# Patient Record
Sex: Male | Born: 1985 | Race: White | Hispanic: No | Marital: Single | Smoking: Current every day smoker
Health system: Southern US, Community
[De-identification: ages and names within clinical notes are randomized; demographics above are authoritative.]

---

## 2017-12-22 ENCOUNTER — Other Ambulatory Visit: Payer: Self-pay

## 2017-12-22 ENCOUNTER — Emergency Department (HOSPITAL_COMMUNITY)
Admission: EM | Admit: 2017-12-22 | Discharge: 2017-12-23 | Disposition: A | Discharge status: Home / Self Care | Payer: Self-pay | Attending: Emergency Medicine | Admitting: Emergency Medicine

## 2017-12-22 ENCOUNTER — Emergency Department (HOSPITAL_COMMUNITY): Payer: Self-pay

## 2017-12-22 ENCOUNTER — Encounter (HOSPITAL_COMMUNITY): Payer: Self-pay | Admitting: Emergency Medicine

## 2017-12-22 DIAGNOSIS — F17210 Nicotine dependence, cigarettes, uncomplicated: Secondary | ICD-10-CM | POA: Insufficient documentation

## 2017-12-22 DIAGNOSIS — K8010 Calculus of gallbladder with chronic cholecystitis without obstruction: Secondary | ICD-10-CM | POA: Diagnosis present

## 2017-12-22 DIAGNOSIS — R1011 Right upper quadrant pain: Secondary | ICD-10-CM | POA: Insufficient documentation

## 2017-12-22 DIAGNOSIS — Z79899 Other long term (current) drug therapy: Secondary | ICD-10-CM | POA: Insufficient documentation

## 2017-12-22 LAB — URINALYSIS, ROUTINE W REFLEX MICROSCOPIC
Bacteria, UA: NONE SEEN
Bilirubin Urine: NEGATIVE
GLUCOSE, UA: NEGATIVE mg/dL
Hgb urine dipstick: NEGATIVE
KETONES UR: 20 mg/dL — AB
LEUKOCYTES UA: NEGATIVE
Nitrite: NEGATIVE
Protein, ur: 30 mg/dL — AB
Specific Gravity, Urine: 1.027 (ref 1.005–1.030)
pH: 5 (ref 5.0–8.0)

## 2017-12-22 MED ORDER — ONDANSETRON HCL 4 MG/2ML IJ SOLN
4.0000 mg | Freq: Once | INTRAMUSCULAR | Status: AC
  Administered 2017-12-23: 4 mg via INTRAVENOUS
  Filled 2017-12-22: qty 2

## 2017-12-22 MED ORDER — SODIUM CHLORIDE 0.9 % IV BOLUS
1000.0000 mL | Freq: Once | INTRAVENOUS | Status: AC
  Administered 2017-12-23: 1000 mL via INTRAVENOUS

## 2017-12-22 MED ORDER — MORPHINE SULFATE (PF) 4 MG/ML IV SOLN
4.0000 mg | Freq: Once | INTRAVENOUS | Status: AC
  Administered 2017-12-23: 4 mg via INTRAVENOUS
  Filled 2017-12-22: qty 1

## 2017-12-22 NOTE — ED Provider Notes (Signed)
[facility]-EMERGENCY DEPT Provider Note   CSN: [id number] Arrival date & time: 12/22/17  1859     History   Chief Complaint Chief Complaint  Patient presents with  . Abdominal Pain    HPI Derrick Parks is a 32 y.o. male.  HPI 32 year old Caucasian male with no pertinent past medical history presents to the ED for evaluation of right upper quadrant abdominal pain with associated abdominal distention, nausea, decreased appetite and constipation.  Patient states that his abdominal pain started 3 days ago.  Has been constant.  Describes it as cramping in nature.  Patient also reports last bowel movement was yesterday.  Patient thought that his pain was due to gas and took a Gas-X and Mylanta at home with little relief.  Patient endorses nausea and vomiting x2.  Patient denies any urinary symptoms.  Patient seen in urgent care today and sent to the ED for possible cholecystitis.  Denies abdominal surgeries.  Does report passing gas.  Denies any melena or hematochezia.  Pt denies any fever, chill, ha, vision changes, lightheadedness, dizziness, congestion, neck pain, cp, sob, cough,urinary symptoms,  melena, hematochezia, lower extremity paresthesias.  History reviewed. No pertinent past medical history.  There are no active problems to display for this patient.         Home Medications    Prior to Admission medications   Medication Sig Start Date End Date Taking? Authorizing Provider  calcium carbonate (TUMS - DOSED IN MG ELEMENTAL CALCIUM) 500 MG chewable tablet Chew 2 tablets by mouth 3 (three) times daily as needed for indigestion or heartburn.   Yes [provider]  ibuprofen (ADVIL,MOTRIN) 200 MG tablet Take 400 mg by mouth every 6 (six) hours as needed for moderate pain.   Yes [provider]  omeprazole (PRILOSEC) 20 MG capsule Take 20 mg by mouth daily as needed (Heart burn).   Yes [provider]  Simethicone (MYLANTA GAS  MINIS) 41.667 MG CHEW Chew 3 tablets by mouth 3 (three) times daily as needed (heart burn).   Yes [provider]    Family History No family history on file.  Social History Social History   Tobacco Use  . Smoking status: Current Every Day Smoker    Types: E-cigarettes  . Smokeless tobacco: Never Used  Substance Use Topics  . Alcohol use: Not on file  . Drug use: Not on file     Allergies   Patient has no known allergies.   Review of Systems Review of Systems  All other systems reviewed and are negative.    Physical Exam Updated Vital Signs BP (!) 147/98 (BP Location: Right Arm)   Pulse (!) 109   Temp 99 F (37.2 C) (Oral)   Resp 20   SpO2 97%   Physical Exam  Constitutional: He is oriented to person, place, and time. He appears well-developed and well-nourished.  Non-toxic appearance. No distress.  HENT:  Head: Normocephalic and atraumatic.  Mouth/Throat: Oropharynx is clear and moist.  Eyes: Pupils are equal, round, and reactive to light. Conjunctivae are normal. Right eye exhibits no discharge. Left eye exhibits no discharge.  Neck: Normal range of motion. Neck supple.  Cardiovascular: Normal rate, regular rhythm, normal heart sounds and intact distal pulses. Exam reveals no gallop and no friction rub.  No murmur heard. Pulmonary/Chest: Effort normal and breath sounds normal. No respiratory distress. He exhibits no tenderness.  Abdominal: Soft. Normal appearance and bowel sounds are normal. He exhibits no distension. There  is tenderness in the right upper quadrant and epigastric area. There is no rigidity, no rebound, no guarding, no CVA tenderness, no tenderness at McBurney's point and negative Murphy's sign.  Obese abd  Musculoskeletal: Normal range of motion. He exhibits no tenderness.  Lymphadenopathy:    He has no cervical adenopathy.  Neurological: He is alert and oriented to person, place, and time.  Skin: Skin is warm and dry. Capillary  refill takes less than 2 seconds. No rash noted.  Psychiatric: His behavior is normal. Judgment and thought content normal.  Nursing note and vitals reviewed.    ED Treatments / Results  Labs (all labs ordered are listed, but only abnormal results are displayed) Labs Reviewed  URINALYSIS, ROUTINE W REFLEX MICROSCOPIC - Abnormal; Notable for the following components:      Result Value   Color, Urine AMBER (*)    Ketones, ur 20 (*)    Protein, ur 30 (*)    All other components within normal limits  CBC WITH DIFFERENTIAL/PLATELET - Abnormal; Notable for the following components:   WBC 11.0 (*)    Neutro Abs 8.7 (*)    All other components within normal limits  COMPREHENSIVE METABOLIC PANEL - Abnormal; Notable for the following components:   Glucose, Bld 107 (*)    Calcium 8.7 (*)    Total Bilirubin 1.3 (*)    All other components within normal limits  LIPASE, BLOOD    EKG None  Radiology Dg Abdomen 1 View  Result Date: 12/23/2017 CLINICAL DATA:  Constipation EXAM: ABDOMEN - 1 VIEW COMPARISON:  None. FINDINGS: Increased stool retention is identified along the ascending colon. No bowel obstruction is identified. Phleboliths are present within the pelvis. Genitourinary calculus is apparent. No acute osseous abnormality is seen. Mild facet sclerosis at L5-S1 bilaterally. IMPRESSION: Increased colonic stool retention along the ascending colon. Otherwise negative. Electronically Signed   By: Tollie Eth M.D.   On: 12/23/2017 00:01   US Abdomen Limited  Result Date: 12/23/2017 CLINICAL DATA:  Right upper quadrant pain for 3 days. EXAM: ULTRASOUND ABDOMEN LIMITED RIGHT UPPER QUADRANT COMPARISON:  None. FINDINGS: Gallbladder: Physiologically distended containing multiple gallstones. There is gallbladder wall thickening up to 12 mm. No pericholecystic fluid. No sonographic Murphy sign noted by sonographer. Common bile duct: Diameter: 5 mm. Liver: Diffusely increased parenchymal echogenicity,  liver parenchyma is difficult to penetrate. No evidence of focal lesion. Portal vein is patent on color Doppler imaging with normal direction of blood flow towards the liver. IMPRESSION: 1. Multiple gallstones with gallbladder wall thickening but no pericholecystic fluid or sonographic Murphy sign. Lack of sonographic Murphy sign and degree of wall thickening favors chronic gallbladder disease over acute inflammation. Consider nuclear medicine HIDA scan for further evaluation. 2. Hepatic steatosis. Electronically Signed   By: Rubye Oaks M.D.   On: 12/23/2017 00:22    Procedures Procedures (including critical care time)  Medications Ordered in ED Medications  ondansetron (ZOFRAN) injection 4 mg (4 mg Intravenous Given 12/23/17 0015)  morphine 4 MG/ML injection 4 mg (4 mg Intravenous Given 12/23/17 0015)  sodium chloride 0.9 % bolus 1,000 mL (0 mLs Intravenous Stopped 12/23/17 0116)  ketorolac (TORADOL) 30 MG/ML injection 15 mg (15 mg Intravenous Given 12/23/17 0223)     Initial Impression / Assessment and Plan / ED Course  I have reviewed the triage vital signs and the nursing notes.  Pertinent labs & imaging results that were available during my care of the patient were reviewed by me and  considered in my medical decision making (see chart for details).     Patient presents to the ED with right upper quadrant abdominal pain with associated nausea and constipation.  Patient afebrile in the ED.  Otherwise vital signs reassuring.  On exam patient has a pain to palpation in the right upper quadrant with a positive Murphy sign.  No signs of surgical abdomen.  Bowel sounds present in all 4 quadrants.  Patient has a mild leukocytosis of 11,000.  Otherwise lab work reassuring.  Normal liver enzymes and lipase.  UA shows no signs of infection.  Ultrasound was obtained that shows thickened gallbladder wall with multiple gallstones.  KUB shows moderate colonic stool burden but no signs of obstruction.   Patient's pain improved with Toradol and morphine in the ED.  Tolerating p.o. fluids.  Did discuss patient with Dr. Gerrit Friends with general surgery who evaluated patient.  Refer to his consult note.  He offered patient admission and cholecystectomy this week and however patient refused and [facility]  The patient is hemodynamic stable.  Does not meet Sirs or sepsis criteria.  Repeat examination of abdomen shows a benign abdomen.  Tolerating p.o. fluids.  Patient has no pericholecystic fluid.  Dr. Gerrit Friends and myself feel comfortable patient discharged with follow-up next week at the [place].  Will give course of pain medication nausea medication for home use.  Discussed reasons to return to ED immediately.  Pt is hemodynamically stable, in NAD, & able to ambulate in the ED. Evaluation does not show pathology that would require ongoing emergent intervention or inpatient treatment. I explained the diagnosis to the patient. Pain has been managed & has no complaints prior to dc. Pt is comfortable with above plan and is stable for discharge at this time. All questions were answered prior to disposition. Strict return precautions for f/u to the ED were discussed. Encouraged follow up with PCP.   Final Clinical Impressions(s) / ED Diagnoses   Final diagnoses:  RUQ pain    ED Discharge Orders         Ordered    HYDROcodone-acetaminophen (NORCO/VICODIN) 5-325 MG tablet  Every 4 hours PRN     12/23/17 0311    ondansetron (ZOFRAN ODT) 4 MG disintegrating tablet  Every 8 hours PRN     12/23/17 0311           Rise Mu, PA-C 12/23/17 1610    Derwood Kaplan, MD 12/23/17 2318

## 2017-12-22 NOTE — ED Notes (Signed)
Urine and culture sent down to lab 

## 2017-12-22 NOTE — ED Triage Notes (Signed)
Pt arriving with abdominal pain x3 days. Pt reports taking gas-x and mylanta at home with little relief. Last BM yesterday. Endorses nausea and vomiting x2.

## 2017-12-23 ENCOUNTER — Encounter (HOSPITAL_COMMUNITY): Payer: Self-pay | Admitting: Surgery

## 2017-12-23 DIAGNOSIS — K8010 Calculus of gallbladder with chronic cholecystitis without obstruction: Secondary | ICD-10-CM | POA: Diagnosis present

## 2017-12-23 LAB — COMPREHENSIVE METABOLIC PANEL
ALBUMIN: 3.6 g/dL (ref 3.5–5.0)
ALK PHOS: 41 U/L (ref 38–126)
ALT: 34 U/L (ref 0–44)
ANION GAP: 10 (ref 5–15)
AST: 23 U/L (ref 15–41)
BILIRUBIN TOTAL: 1.3 mg/dL — AB (ref 0.3–1.2)
BUN: 15 mg/dL (ref 6–20)
CO2: 25 mmol/L (ref 22–32)
Calcium: 8.7 mg/dL — ABNORMAL LOW (ref 8.9–10.3)
Chloride: 100 mmol/L (ref 98–111)
Creatinine, Ser: 1.14 mg/dL (ref 0.61–1.24)
GFR calc Af Amer: >60 mL/min (ref >60)
GFR calc non Af Amer: >60 mL/min (ref >60)
GLUCOSE: 107 mg/dL — AB (ref 70–99)
Potassium: 4.1 mmol/L (ref 3.5–5.1)
Sodium: 135 mmol/L (ref 135–145)
TOTAL PROTEIN: 7.2 g/dL (ref 6.5–8.1)

## 2017-12-23 LAB — CBC WITH DIFFERENTIAL/PLATELET
BASOS ABS: 0 10*3/uL (ref 0.0–0.1)
Basophils Relative: 0 %
Eosinophils Absolute: 0.1 10*3/uL (ref 0.0–0.7)
Eosinophils Relative: 1 %
HEMATOCRIT: 43.3 % (ref 39.0–52.0)
Hemoglobin: 15 g/dL (ref 13.0–17.0)
Lymphocytes Relative: 11 %
Lymphs Abs: 1.2 10*3/uL (ref 0.7–4.0)
MCH: 28.5 pg (ref 26.0–34.0)
MCHC: 34.6 g/dL (ref 30.0–36.0)
MCV: 82.3 fL (ref 78.0–100.0)
MONO ABS: 1 10*3/uL (ref 0.1–1.0)
MONOS PCT: 9 %
NEUTROS ABS: 8.7 10*3/uL — AB (ref 1.7–7.7)
Neutrophils Relative %: 79 %
Platelets: 205 10*3/uL (ref 150–400)
RBC: 5.26 MIL/uL (ref 4.22–5.81)
RDW: 13.8 % (ref 11.5–15.5)
WBC: 11 10*3/uL — ABNORMAL HIGH (ref 4.0–10.5)

## 2017-12-23 LAB — LIPASE, BLOOD: Lipase: 27 U/L (ref 11–51)

## 2017-12-23 MED ORDER — KETOROLAC TROMETHAMINE 30 MG/ML IJ SOLN
15.0000 mg | Freq: Once | INTRAMUSCULAR | Status: AC
  Administered 2017-12-23: 15 mg via INTRAVENOUS
  Filled 2017-12-23: qty 1

## 2017-12-23 MED ORDER — HYDROCODONE-ACETAMINOPHEN 5-325 MG PO TABS: 2.0000 | ORAL_TABLET | ORAL | 0 refills | Status: AC | PRN

## 2017-12-23 MED ORDER — ONDANSETRON 4 MG PO TBDP: 4.0000 mg | ORAL_TABLET | Freq: Three times a day (TID) | ORAL | 0 refills | Status: AC | PRN

## 2017-12-23 NOTE — Discharge Instructions (Addendum)
You do have concern for a gallbladder infection.  This will need to be seen by a general surgeon.  Have given you pain medication to take in some Zofran for nausea.  If you develop any fevers, worsening pain or worsening vomiting return to the ED immediately.  [facility]

## 2017-12-23 NOTE — ED Notes (Signed)
US tech at bedside

## 2017-12-23 NOTE — Consult Note (Signed)
General Surgery [facility]  Reason for Consult: abdominal pain, chronic cholecystitis, cholelithiasis  Referring Physician: Dr. Kathrynn Humble, [facility]  Derrick Parks is an 32 y.o. male.  HPI: patient is a 32 yo WM who presents with 3 day hx of abdominal pain in the upper abdomen, nausea, and emesis.  No prior episodes of pain like this.  Denies jaundice or acholic stools.  No prior abdominal surgery.  WBC 11K.  USN shows thickened gallbladder wall with multiple gallstones.  LFT's and lipase normal.  Pain has improved.  Patient is offered admission and cholecystectomy this weekend.  Patient would like to follow up at the [facility] medical center and have his surgery in the [place] system.  Asked about dietary restrictions.  Accompanied by family.  History reviewed. No pertinent past medical history.  History reviewed. No pertinent surgical history.  History reviewed. No pertinent family history.  Social History:  reports that he has been smoking e-cigarettes. He has never used smokeless tobacco. His alcohol and drug histories are not on file.  Allergies: No Known Allergies  Medications: I have reviewed the patient's current medications.  Results for orders placed or performed during the hospital encounter of 12/22/17 (from the past 48 hour(s))  Urinalysis, Routine w reflex microscopic     Status: Abnormal   Collection Time: 12/22/17  8:19 PM  Result Value Ref Range   Color, Urine AMBER (A) YELLOW    Comment: BIOCHEMICALS MAY BE AFFECTED BY COLOR   APPearance CLEAR CLEAR   Specific Gravity, Urine 1.027 1.005 - 1.030   pH 5.0 5.0 - 8.0   Glucose, UA NEGATIVE NEGATIVE mg/dL   Hgb urine dipstick NEGATIVE NEGATIVE   Bilirubin Urine NEGATIVE NEGATIVE   Ketones, ur 20 (A) NEGATIVE mg/dL   Protein, ur 30 (A) NEGATIVE mg/dL   Nitrite NEGATIVE NEGATIVE   Leukocytes, UA NEGATIVE NEGATIVE   RBC / HPF 0-5 0 - 5 RBC/hpf   WBC, UA 0-5 0 - 5 WBC/hpf   Bacteria, UA NONE SEEN NONE SEEN    Mucus PRESENT     Comment: Performed at [facility], [place] [address], [place], [place] [address]  CBC with Differential     Status: Abnormal   Collection Time: 12/22/17 11:45 PM  Result Value Ref Range   WBC 11.0 (H) 4.0 - 10.5 K/uL   RBC 5.26 4.22 - 5.81 MIL/uL   Hemoglobin 15.0 13.0 - 17.0 g/dL   HCT 43.3 39.0 - 52.0 %   MCV 82.3 78.0 - 100.0 fL   MCH 28.5 26.0 - 34.0 pg   MCHC 34.6 30.0 - 36.0 g/dL   RDW 13.8 11.5 - 15.5 %   Platelets 205 150 - 400 K/uL   Neutrophils Relative % 79 %   Neutro Abs 8.7 (H) 1.7 - 7.7 K/uL   Lymphocytes Relative 11 %   Lymphs Abs 1.2 0.7 - 4.0 K/uL   Monocytes Relative 9 %   Monocytes Absolute 1.0 0.1 - 1.0 K/uL   Eosinophils Relative 1 %   Eosinophils Absolute 0.1 0.0 - 0.7 K/uL   Basophils Relative 0 %   Basophils Absolute 0.0 0.0 - 0.1 K/uL    Comment: Performed at [facility], [place] [address] [place], [place] [address]  Lipase, blood     Status: None   Collection Time: 12/22/17 11:45 PM  Result Value Ref Range   Lipase 27 11 - 51 U/L    Comment: Performed at [facility], [place] [address] [place], [place] [address]  Comprehensive metabolic panel     Status: Abnormal   Collection Time: 12/22/17 11:45 PM  Result Value Ref Range   Sodium 135 135 - 145 mmol/L   Potassium 4.1 3.5 - 5.1 mmol/L   Chloride 100 98 - 111 mmol/L   CO2 25 22 - 32 mmol/L   Glucose, Bld 107 (H) 70 - 99 mg/dL   BUN 15 6 - 20 mg/dL   Creatinine, Ser 1.14 0.61 - 1.24 mg/dL   Calcium 8.7 (L) 8.9 - 10.3 mg/dL   Total Protein 7.2 6.5 - 8.1 g/dL   Albumin 3.6 3.5 - 5.0 g/dL   AST 23 15 - 41 U/L   ALT 34 0 - 44 U/L   Alkaline Phosphatase 41 38 - 126 U/L   Total Bilirubin 1.3 (H) 0.3 - 1.2 mg/dL   GFR calc non Af Amer >60 >60 mL/min   GFR calc Af Amer >60 >60 mL/min    Comment: (NOTE) The eGFR has been calculated using the CKD EPI equation. This calculation has not been validated in all clinical  situations. eGFR's persistently <60 mL/min signify possible Chronic Kidney Disease.    Anion gap 10 5 - 15    Comment: Performed at [facility], [place] [address], [place], [place] [address]    Dg Abdomen 1 View  Result Date: 12/23/2017 CLINICAL DATA:  Constipation EXAM: ABDOMEN - 1 VIEW COMPARISON:  None. FINDINGS: Increased stool retention is identified along the ascending colon. No bowel obstruction is identified. Phleboliths are present within the pelvis. Genitourinary calculus is apparent. No acute osseous abnormality is seen. Mild facet sclerosis at L5-S1 bilaterally. IMPRESSION: Increased colonic stool retention along the ascending colon. Otherwise negative. Electronically Signed   By: Ashley Royalty M.D.   On: 12/23/2017 00:01   US Abdomen Limited  Result Date: 12/23/2017 CLINICAL DATA:  Right upper quadrant pain for 3 days. EXAM: ULTRASOUND ABDOMEN LIMITED RIGHT UPPER QUADRANT COMPARISON:  None. FINDINGS: Gallbladder: Physiologically distended containing multiple gallstones. There is gallbladder wall thickening up to 12 mm. No pericholecystic fluid. No sonographic Murphy sign noted by sonographer. Common bile duct: Diameter: 5 mm. Liver: Diffusely increased parenchymal echogenicity, liver parenchyma is difficult to penetrate. No evidence of focal lesion. Portal vein is patent on color Doppler imaging with normal direction of blood flow towards the liver. IMPRESSION: 1. Multiple gallstones with gallbladder wall thickening but no pericholecystic fluid or sonographic Murphy sign. Lack of sonographic Murphy sign and degree of wall thickening favors chronic gallbladder disease over acute inflammation. Consider nuclear medicine HIDA scan for further evaluation. 2. Hepatic steatosis. Electronically Signed   By: Jeb Levering M.D.   On: 12/23/2017 00:22    Review of Systems  Constitutional: Negative for chills and fever.  HENT: Negative.   Eyes: Negative.   Respiratory:  Negative.   Cardiovascular: Negative.   Gastrointestinal: Positive for abdominal pain (RUQ), nausea and vomiting.  Genitourinary: Negative.   Musculoskeletal: Negative.   Skin: Negative.   Neurological: Negative.   Endo/Heme/Allergies: Negative.   Psychiatric/Behavioral: Negative.    Blood pressure 127/89, pulse 88, temperature 99 F (37.2 C), temperature source Oral, resp. rate 14, SpO2 98 %. Physical Exam  Constitutional: He is oriented to person, place, and time. He appears well-developed and well-nourished. No distress.  HENT:  Head: Normocephalic and atraumatic.  Right Ear: External ear normal.  Left Ear: External ear normal.  Mouth/Throat: No oropharyngeal exudate.  Eyes: Pupils are equal,  round, and reactive to light. Conjunctivae are normal. No scleral icterus.  Neck: Normal range of motion. Neck supple. No tracheal deviation present. No thyromegaly present.  Cardiovascular: Normal rate, regular rhythm and normal heart sounds.  No murmur heard. Respiratory: Effort normal and breath sounds normal. No respiratory distress. He has no wheezes.  GI: Soft. Bowel sounds are normal. He exhibits no distension and no mass. There is tenderness (mild RUQ tenderness). There is no rebound and no guarding.  Musculoskeletal: Normal range of motion. He exhibits no edema, tenderness or deformity.  Neurological: He is alert and oriented to person, place, and time.  Skin: Skin is warm and dry. He is not diaphoretic.  Psychiatric: He has a normal mood and affect. His behavior is normal.    Assessment/Plan: Biliary colic, chronic cholecystitis, cholelithiasis  Discussed admission and cholecystectomy - patient refused  Patient prefers follow up and surgery in the [place] medical system  Recommend pain Rx, low fat diet, follow up as soon as possible  Return to ER if persistent or worsening symptoms  Armandina Gemma, MD [facility] Surgery Office: [place] 12/23/2017, 2:21  AM

## 2018-01-19 ENCOUNTER — Other Ambulatory Visit: Payer: Self-pay | Admitting: Surgery

## 2018-01-19 DIAGNOSIS — K8080 Other cholelithiasis without obstruction: Secondary | ICD-10-CM

## 2018-01-24 ENCOUNTER — Ambulatory Visit
Admission: RE | Admit: 2018-01-24 | Discharge: 2018-01-24 | Disposition: A | Discharge status: Home / Self Care | Payer: No Typology Code available for payment source | Source: Ambulatory Visit | Attending: Surgery | Admitting: Surgery

## 2018-01-24 DIAGNOSIS — K8080 Other cholelithiasis without obstruction: Secondary | ICD-10-CM

## 2019-01-10 IMAGING — DX DG ABDOMEN 1V
1 series · 1 of 1 positions shown · non-contrast
Comparison: None.

CLINICAL DATA: Constipation

EXAM:
ABDOMEN - 1 VIEW

[abdomen kub]
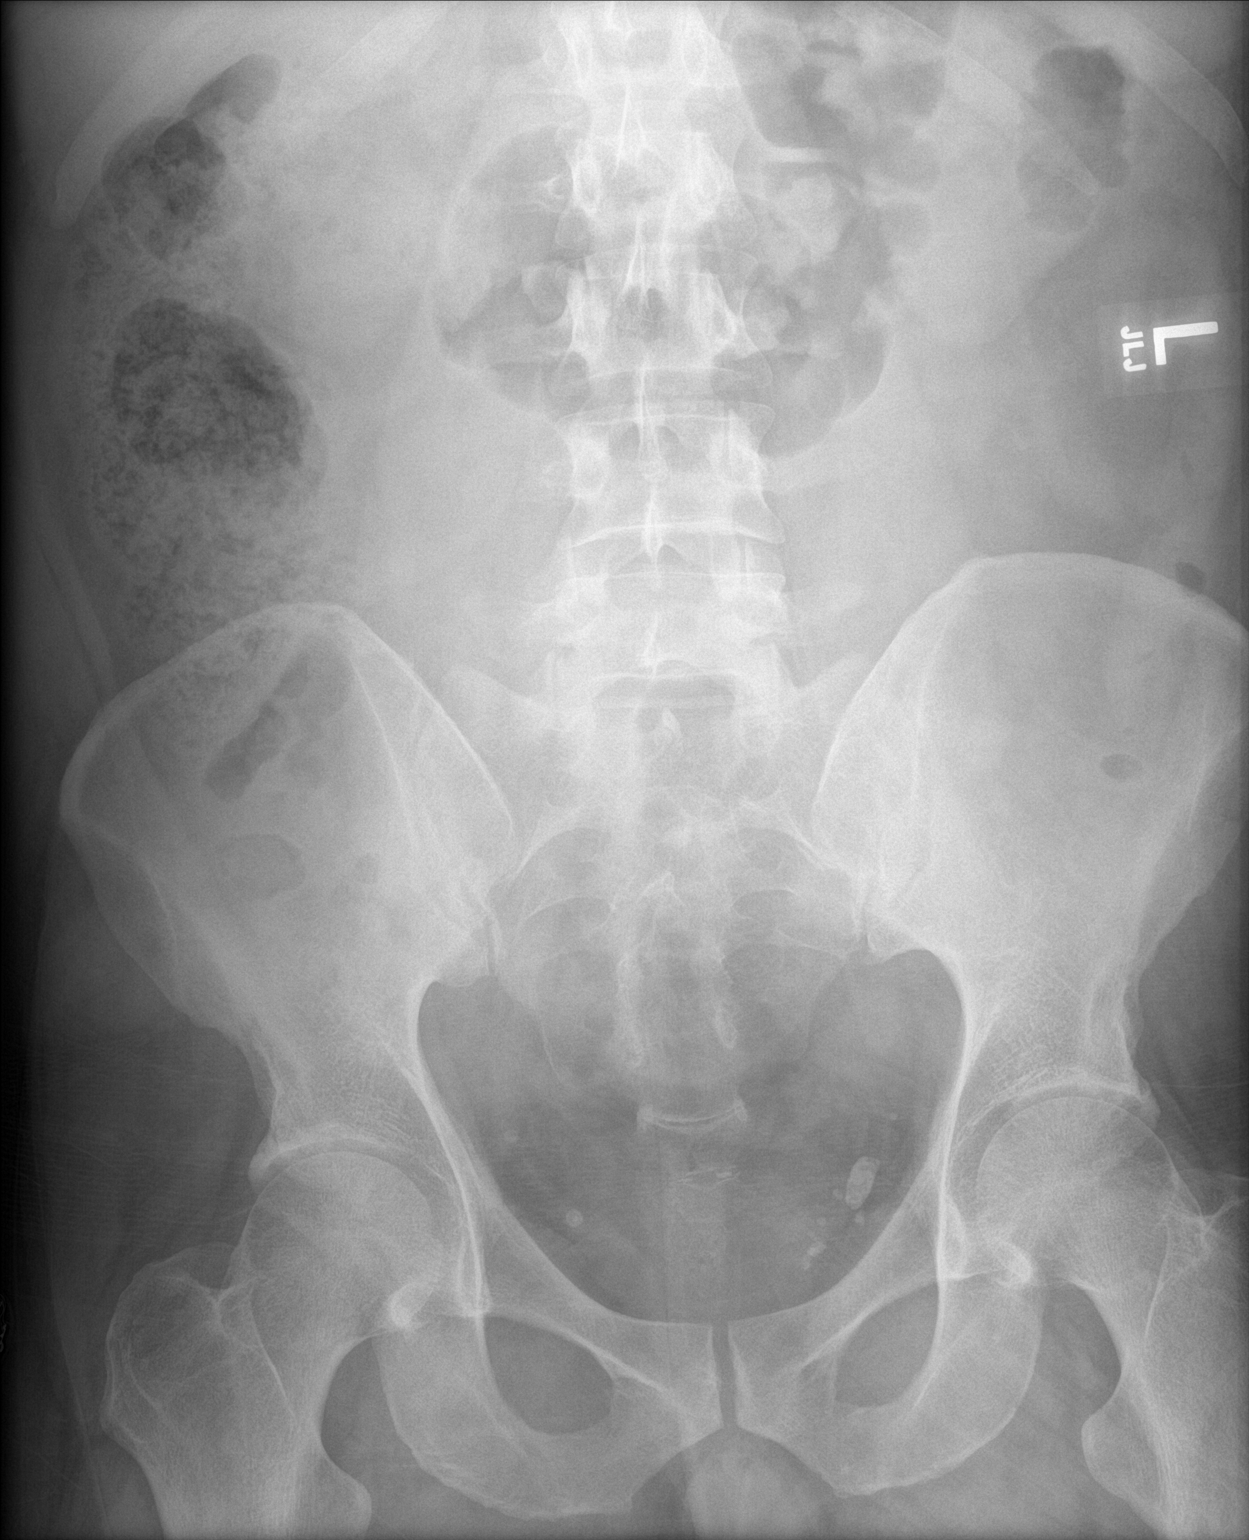

[1 of 1 positions shown; findings below may reference images not displayed]

FINDINGS: Increased stool retention is identified along the ascending colon.
No bowel obstruction is identified. Phleboliths are present within
the pelvis. Genitourinary calculus is apparent. No acute osseous
abnormality is seen. Mild facet sclerosis at L5-S1 bilaterally.
IMPRESSION: Increased colonic stool retention along the ascending colon.
Otherwise negative.

## 2019-01-10 IMAGING — US US ABDOMEN LIMITED
1 series · 14 of 25 positions shown · non-contrast
Comparison: None.

CLINICAL DATA: Right upper quadrant pain for 3 days.

EXAM:
ULTRASOUND ABDOMEN LIMITED RIGHT UPPER QUADRANT

[Series 1: us abdomen limited · 14 of 52 slices shown]
[im 1/52]
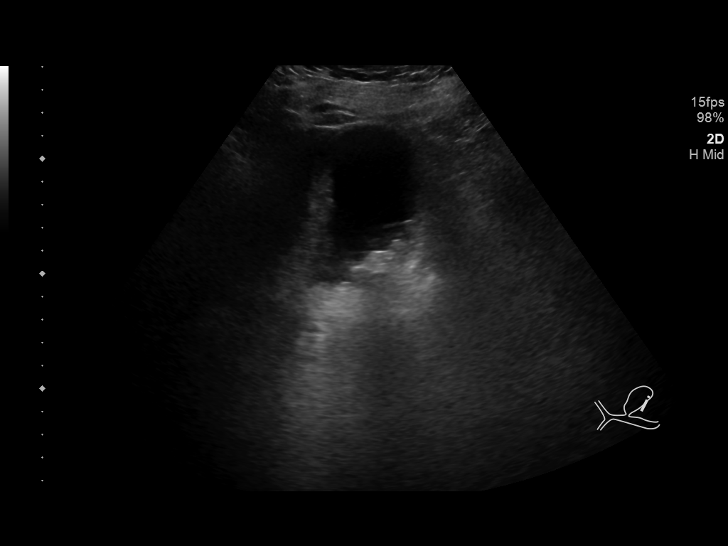
[im 5/52]
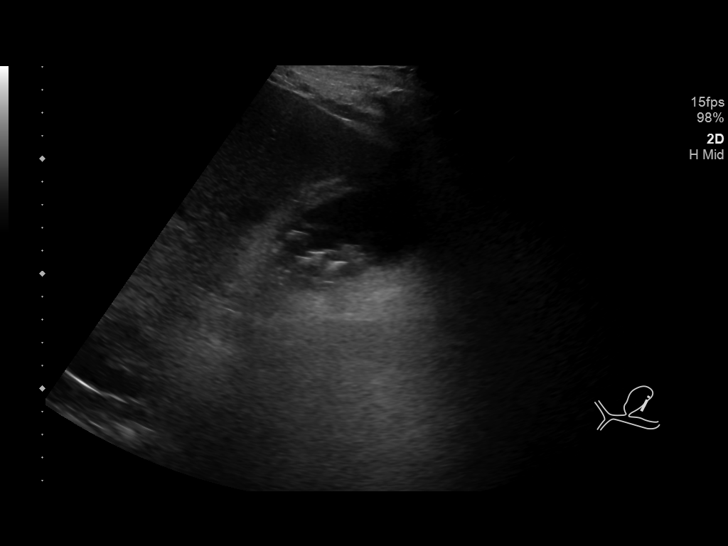
[im 9/52]
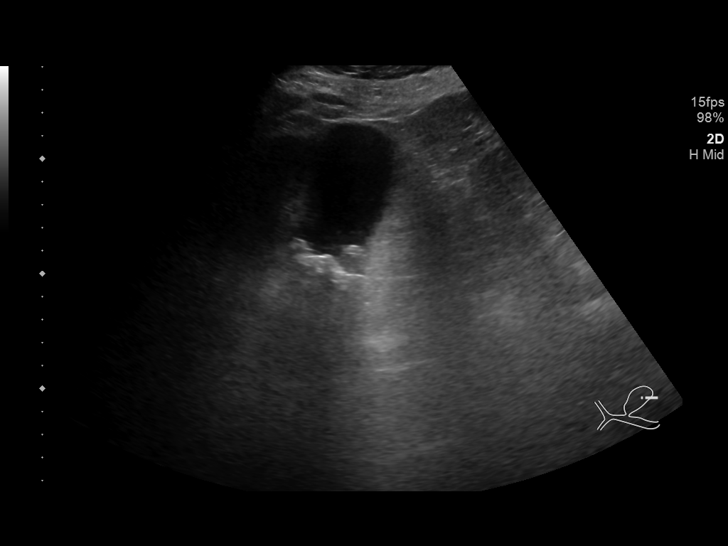
[im 13/52]
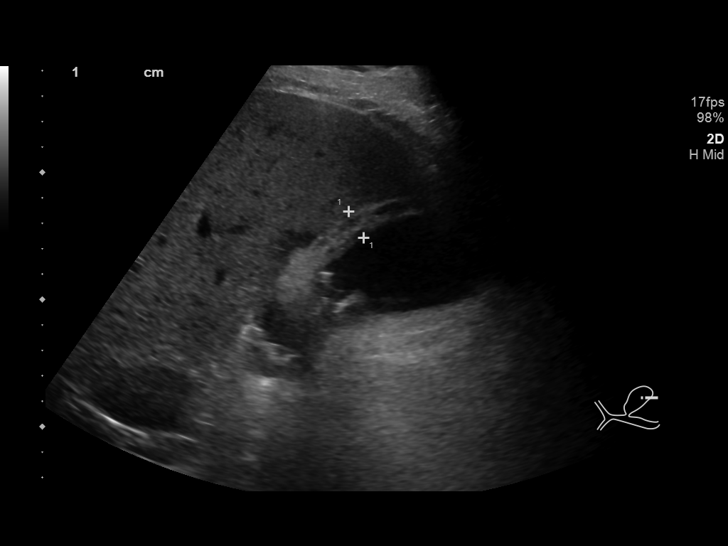
[im 18/52]
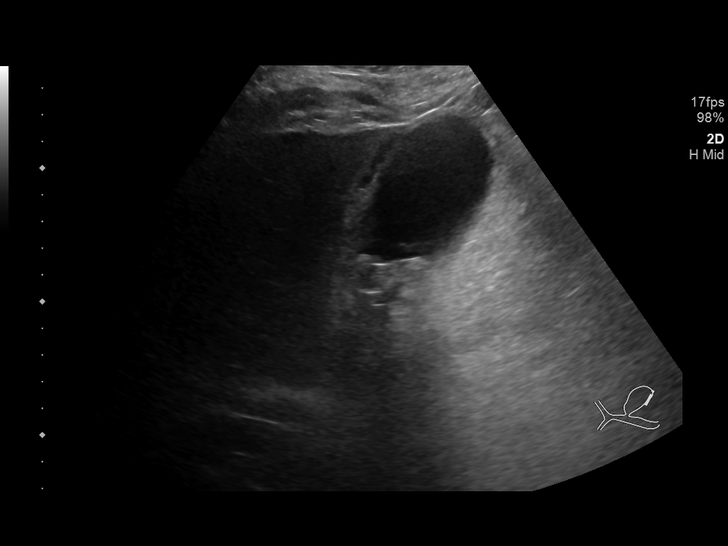
[im 20/52]
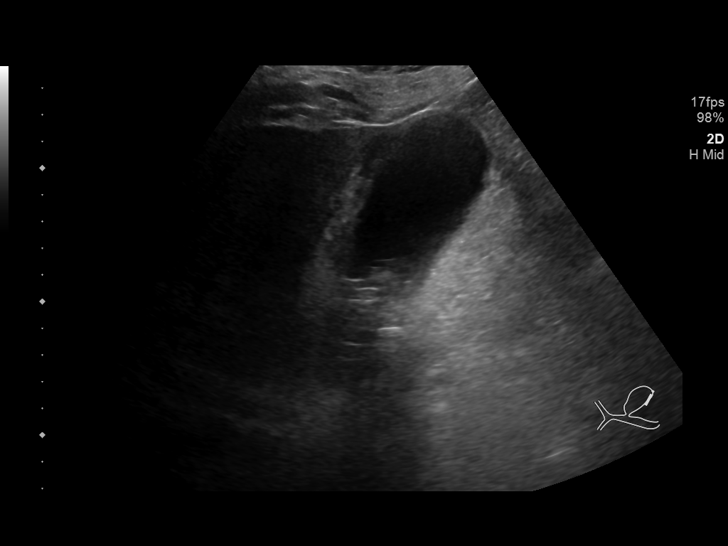
[im 24/52]
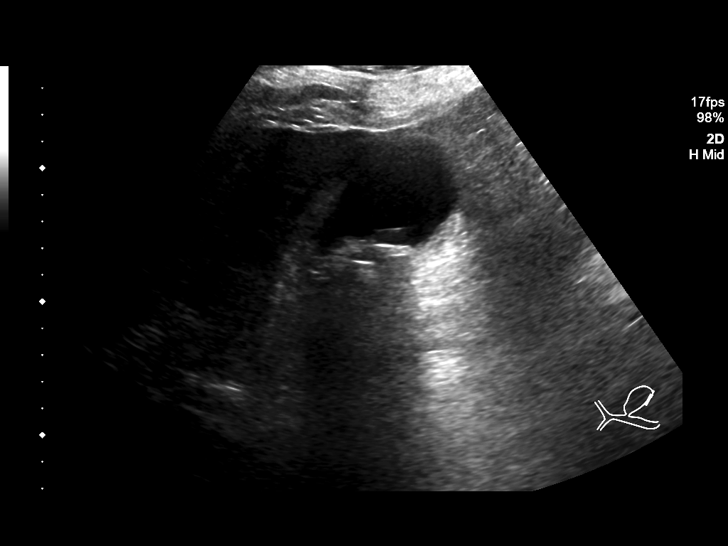
[im 28/52]
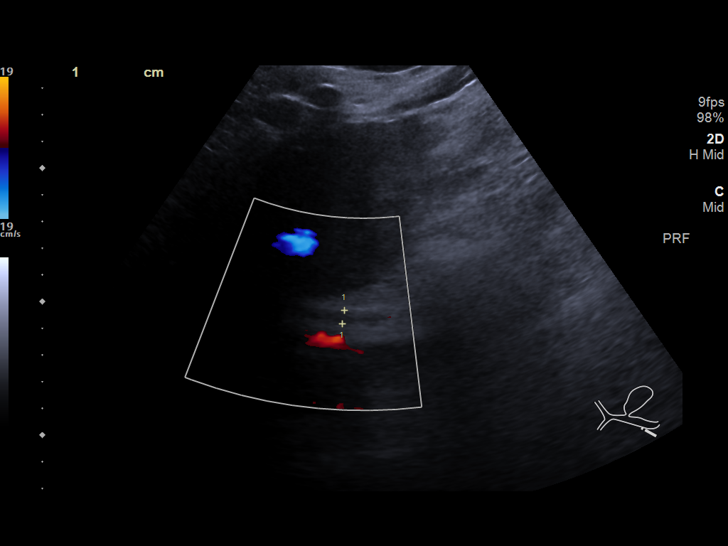
[im 32/52]
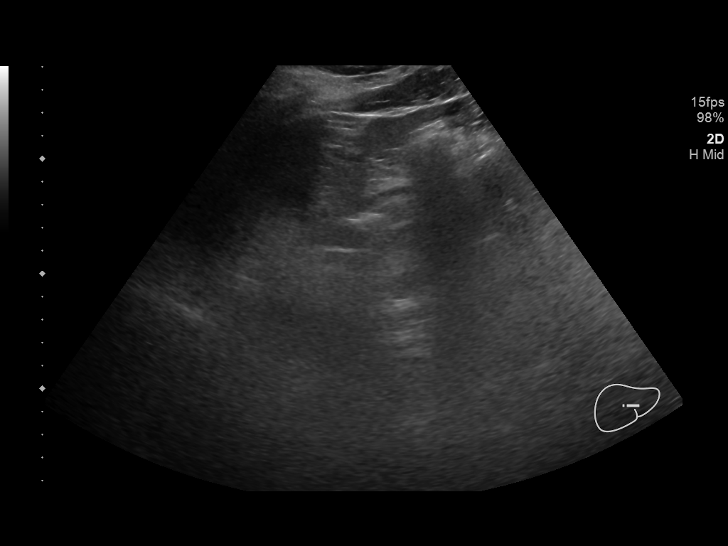
[im 35/52]
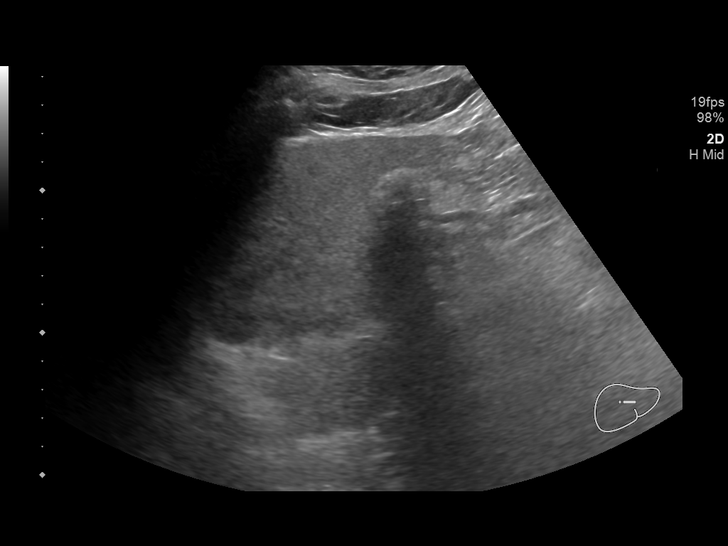
[im 39/52]
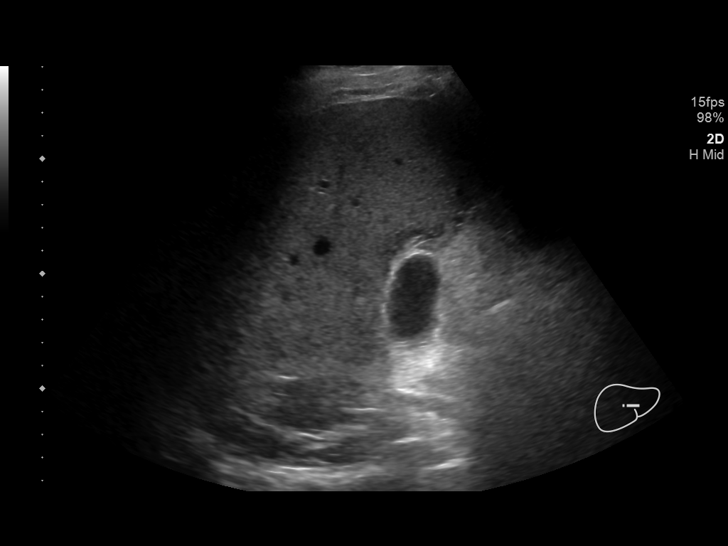
[im 43/52]
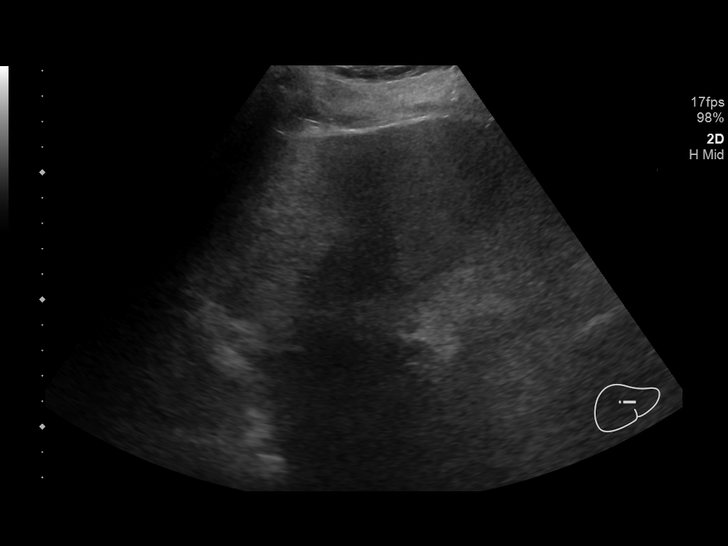
[im 47/52]
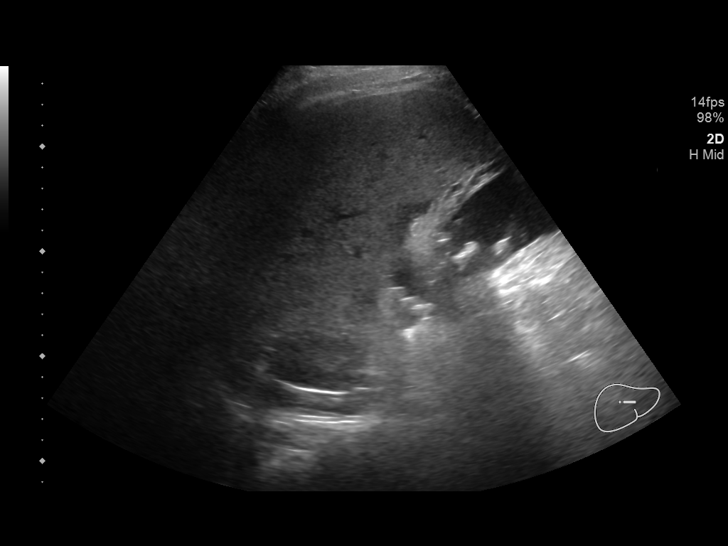
[im 52/52]
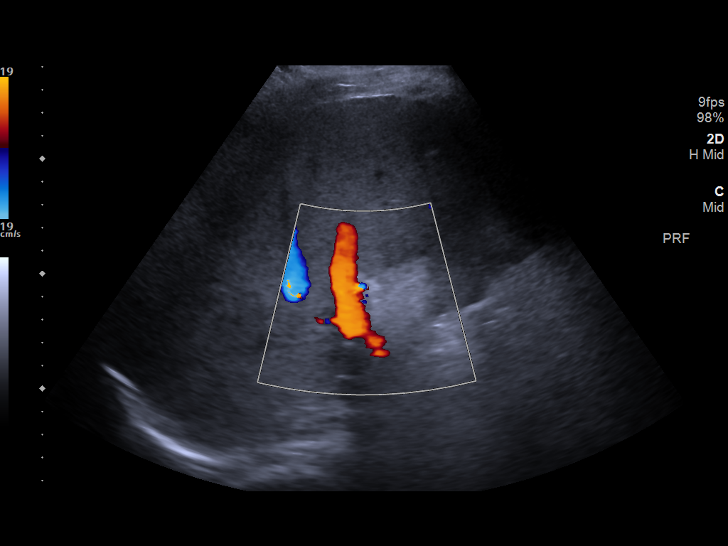

[14 of 25 positions shown; findings below may reference images not displayed]

FINDINGS: Gallbladder:

Physiologically distended containing multiple gallstones. There is
gallbladder wall thickening up to 12 mm. No pericholecystic fluid.
No sonographic Murphy sign noted by sonographer.

Common bile duct:

Diameter: 5 mm.

Liver:

Diffusely increased parenchymal echogenicity, liver parenchyma is
difficult to penetrate. No evidence of focal lesion. Portal vein is
patent on color Doppler imaging with normal direction of blood flow
towards the liver.
IMPRESSION: 1. Multiple gallstones with gallbladder wall thickening but no
pericholecystic fluid or sonographic Murphy sign. Lack of
sonographic Murphy sign and degree of wall thickening favors chronic
gallbladder disease over acute inflammation. Consider nuclear
medicine HIDA scan for further evaluation.
2. Hepatic steatosis.

## 2019-03-06 IMAGING — US US ABDOMEN LIMITED
1 series · 14 of 25 positions shown · non-contrast
Comparison: 12/22/2017.

CLINICAL DATA: Follow-up cholelithiasis.

EXAM:
ULTRASOUND ABDOMEN LIMITED RIGHT UPPER QUADRANT

[Series 1: us abdomen limited · 0.20mm/px · 14 of 50 slices shown]
[im 1/50]
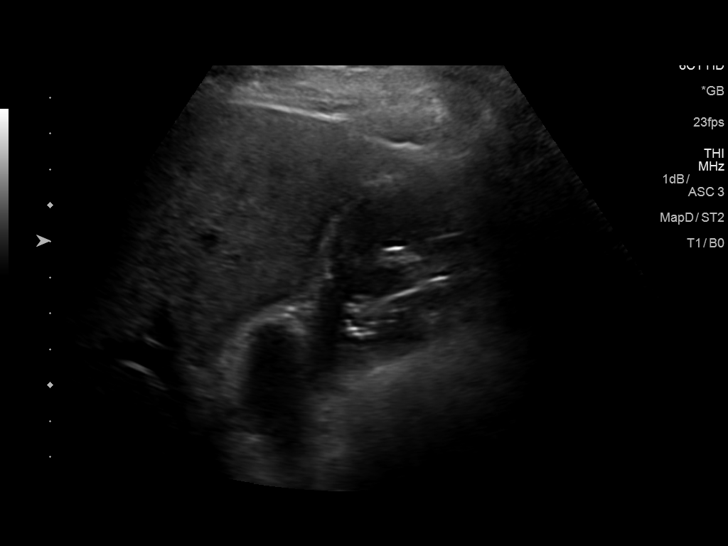
[im 5/50]
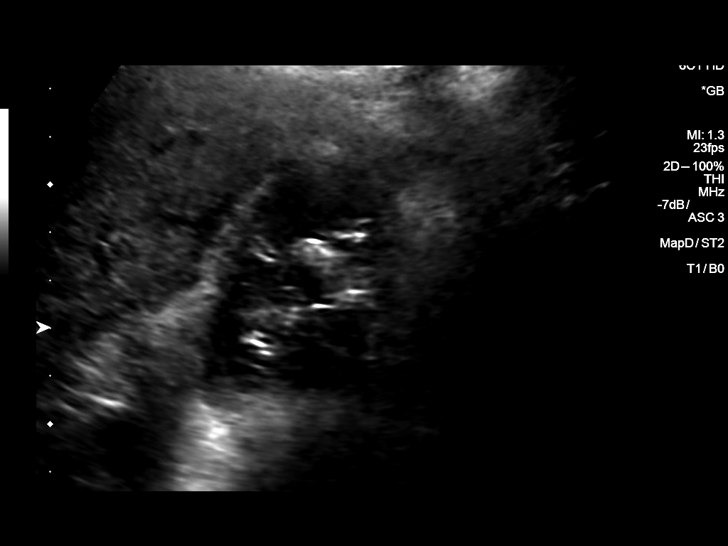
[im 9/50]
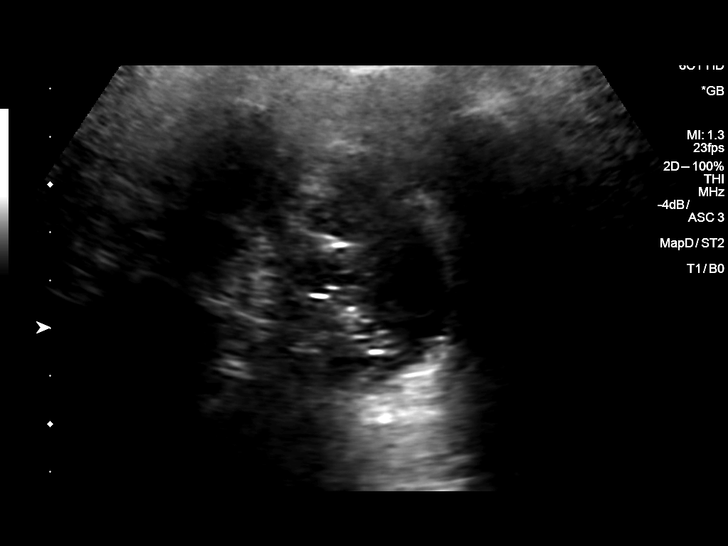
[im 13/50]
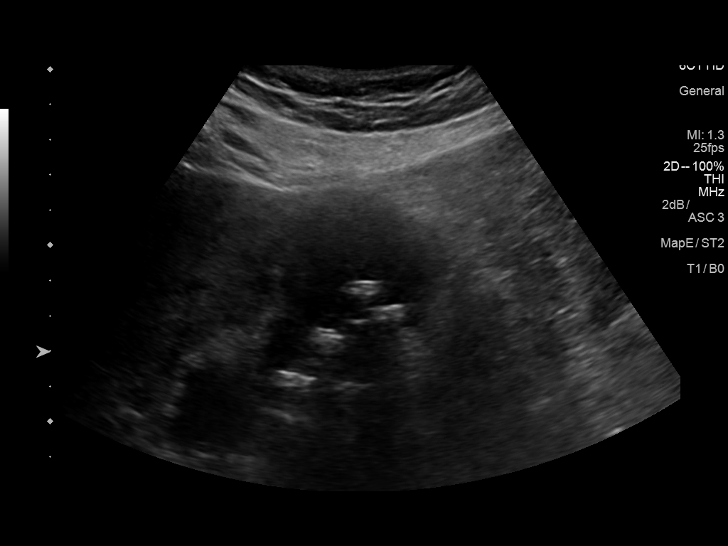
[im 17/50]
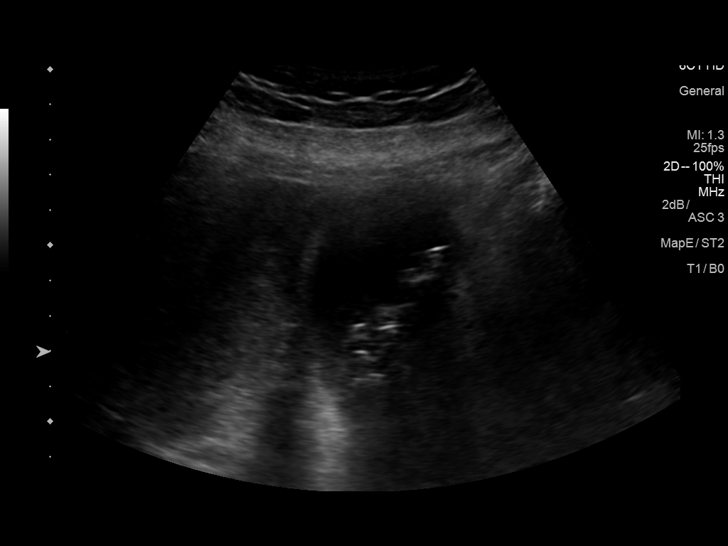
[im 19/50]
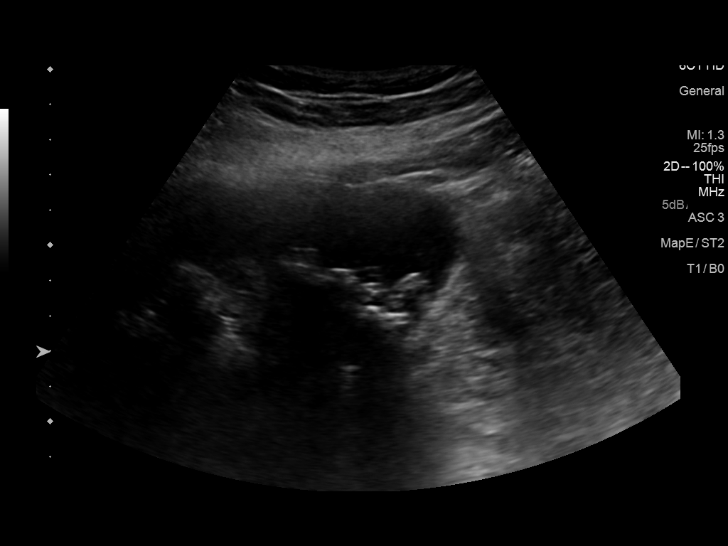
[im 23/50]
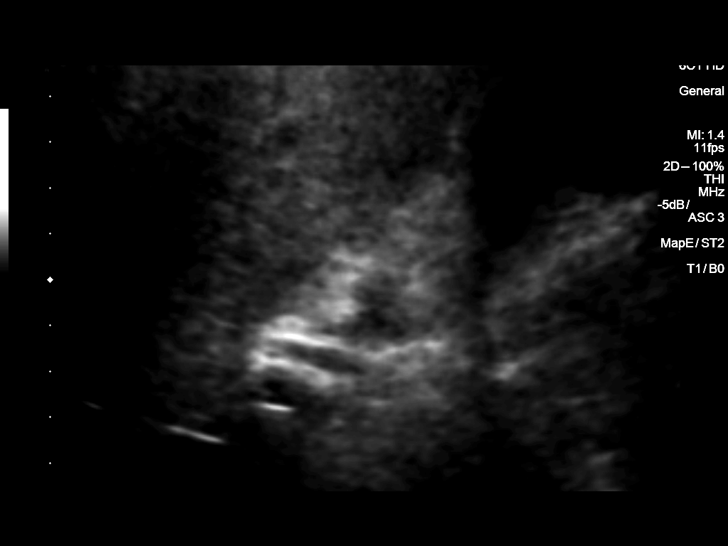
[im 27/50]
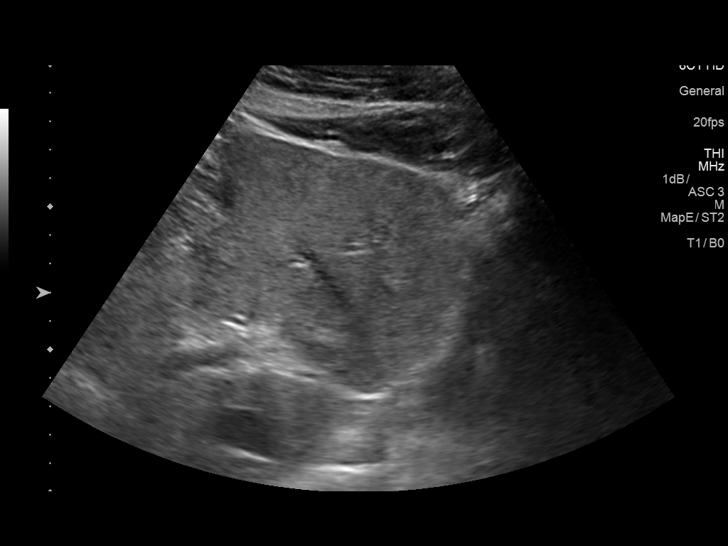
[im 31/50]
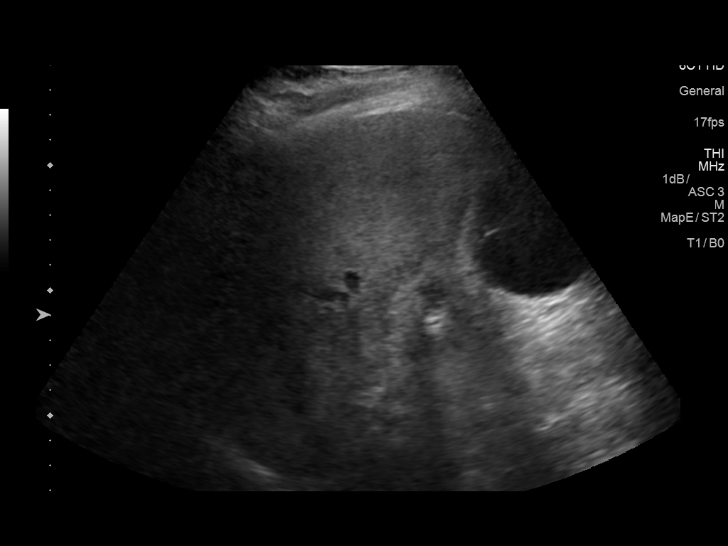
[im 33/50]
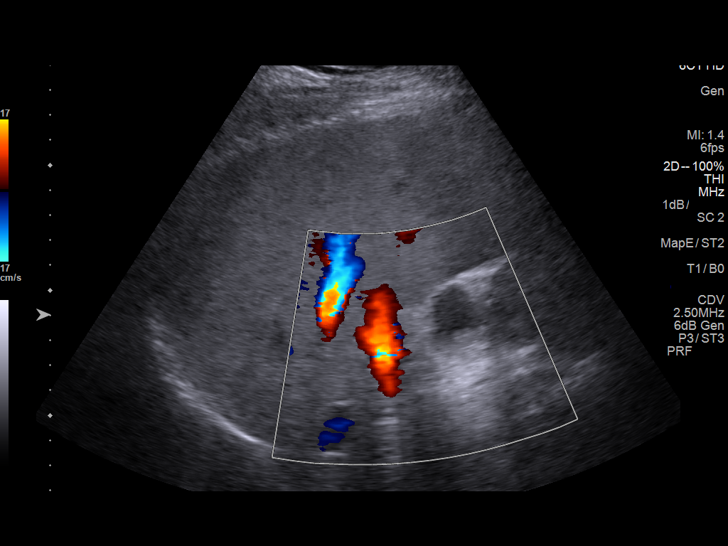
[im 37/50]
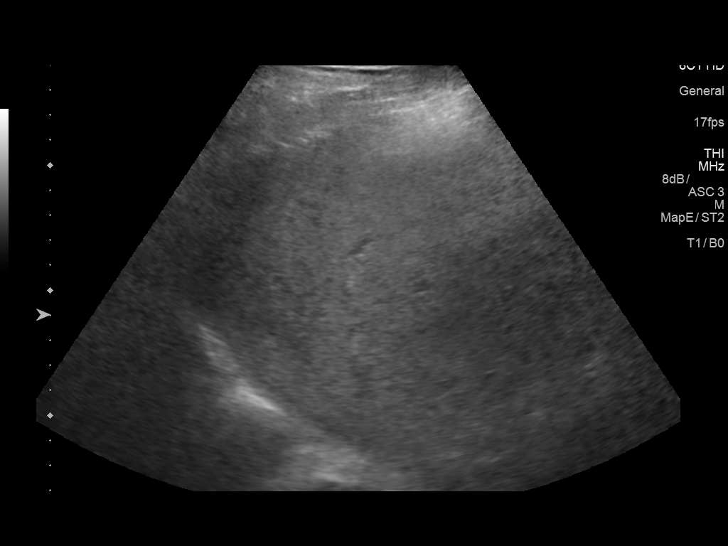
[im 41/50]
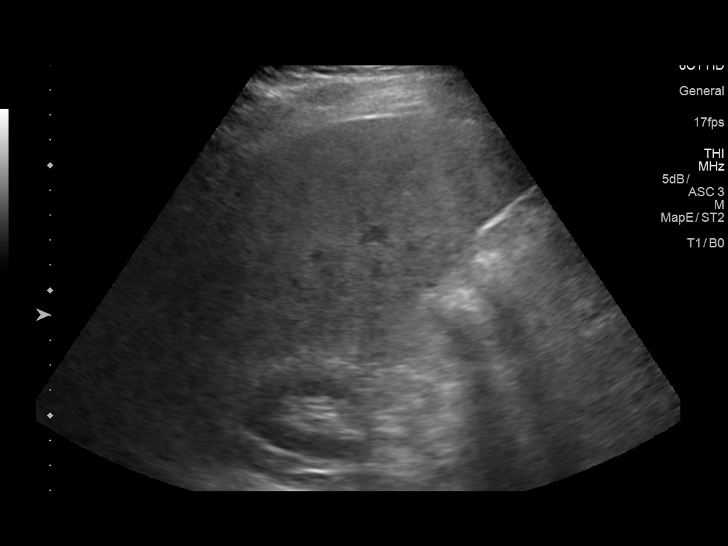
[im 45/50]
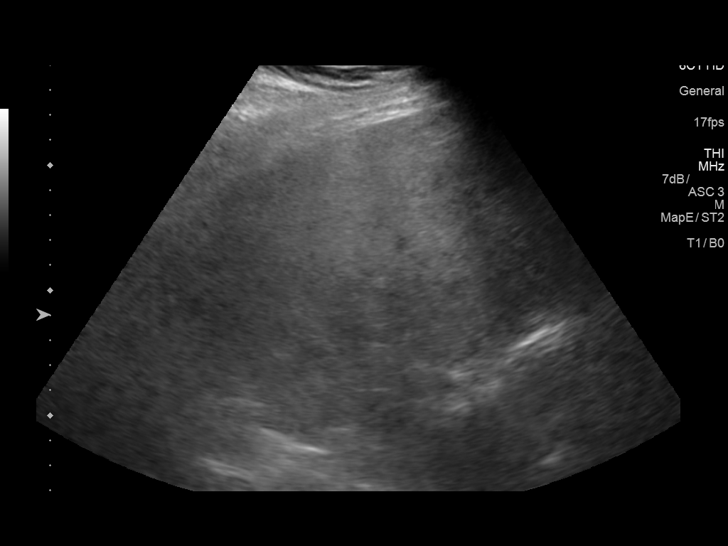
[im 50/50]
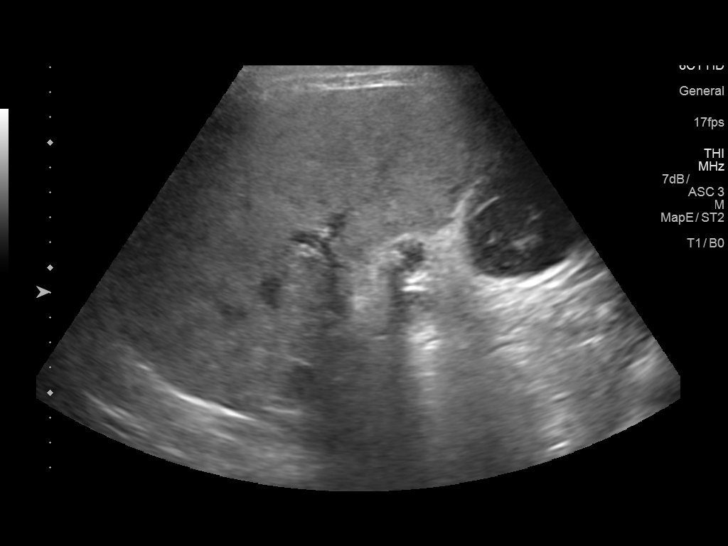

[14 of 25 positions shown; findings below may reference images not displayed]

FINDINGS: Gallbladder:

Multiple gallstones are again demonstrated in the gallbladder
measuring up to 1.5 cm in maximum diameter each. Decreased
gallbladder wall thickening and edema is demonstrated with residual
wall thickening measuring 6 mm. No sonographic Murphy sign.

Common bile duct:

Diameter: 6.1 mm

Liver:

Diffusely echogenic, without significant change. Portal vein is
patent on color Doppler imaging with normal direction of blood flow
towards the liver.
IMPRESSION: 1. Stable cholelithiasis.
2. Decreased gallbladder wall thickening, compatible with improving
changes of cholecystitis.
3. Stable diffusely echogenic liver, most likely due to steatosis.
